# Patient Record
Sex: Male | Born: 2000 | Race: Black or African American | Hispanic: No | Marital: Single | Smoking: Never smoker
Health system: Southern US, Community
[De-identification: ages and names within clinical notes are randomized; demographics above are authoritative.]

---
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
## 2018-03-11 DIAGNOSIS — Z713 Dietary counseling and surveillance: Secondary | ICD-10-CM | POA: Diagnosis not present

## 2018-03-11 DIAGNOSIS — Z7182 Exercise counseling: Secondary | ICD-10-CM | POA: Diagnosis not present

## 2018-03-11 DIAGNOSIS — Z0000 Encounter for general adult medical examination without abnormal findings: Secondary | ICD-10-CM | POA: Diagnosis not present

## 2020-06-04 ENCOUNTER — Encounter: Payer: Self-pay | Admitting: Internal Medicine

## 2020-06-04 ENCOUNTER — Ambulatory Visit: Payer: Self-pay | Admitting: Internal Medicine

## 2020-06-04 ENCOUNTER — Encounter (INDEPENDENT_AMBULATORY_CARE_PROVIDER_SITE_OTHER): Payer: Self-pay

## 2020-06-04 ENCOUNTER — Other Ambulatory Visit: Payer: Self-pay

## 2020-06-04 ENCOUNTER — Ambulatory Visit: Payer: 59 | Admitting: Internal Medicine

## 2020-06-04 VITALS — BP 116/78 | HR 68 | Temp 97.8°F | Resp 16 | Ht 70.0 in | Wt 127.0 lb

## 2020-06-04 DIAGNOSIS — Z0000 Encounter for general adult medical examination without abnormal findings: Secondary | ICD-10-CM | POA: Insufficient documentation

## 2020-06-04 DIAGNOSIS — M2041 Other hammer toe(s) (acquired), right foot: Secondary | ICD-10-CM | POA: Diagnosis not present

## 2020-06-04 DIAGNOSIS — M2042 Other hammer toe(s) (acquired), left foot: Secondary | ICD-10-CM

## 2020-06-04 DIAGNOSIS — Z0001 Encounter for general adult medical examination with abnormal findings: Secondary | ICD-10-CM | POA: Diagnosis not present

## 2020-06-04 DIAGNOSIS — J301 Allergic rhinitis due to pollen: Secondary | ICD-10-CM

## 2020-06-04 DIAGNOSIS — J4520 Mild intermittent asthma, uncomplicated: Secondary | ICD-10-CM

## 2020-06-04 DIAGNOSIS — Z23 Encounter for immunization: Secondary | ICD-10-CM | POA: Diagnosis not present

## 2020-06-04 LAB — CBC WITH DIFFERENTIAL/PLATELET
Basophils Absolute: 0 10*3/uL (ref 0.0–0.1)
Basophils Relative: 0.8 % (ref 0.0–3.0)
Eosinophils Absolute: 0.2 10*3/uL (ref 0.0–0.7)
Eosinophils Relative: 4 % (ref 0.0–5.0)
HCT: 43.4 % (ref 39.0–52.0)
Hemoglobin: 14.4 g/dL (ref 13.0–17.0)
Lymphocytes Relative: 47.7 % — ABNORMAL HIGH (ref 12.0–46.0)
Lymphs Abs: 1.9 10*3/uL (ref 0.7–4.0)
MCHC: 33.3 g/dL (ref 30.0–36.0)
MCV: 89.5 fl (ref 78.0–100.0)
Monocytes Absolute: 0.3 10*3/uL (ref 0.1–1.0)
Monocytes Relative: 8.5 % (ref 3.0–12.0)
Neutro Abs: 1.5 10*3/uL (ref 1.4–7.7)
Neutrophils Relative %: 39 % — ABNORMAL LOW (ref 43.0–77.0)
Platelets: 281 10*3/uL (ref 150.0–400.0)
RBC: 4.85 Mil/uL (ref 4.22–5.81)
RDW: 14.3 % (ref 11.5–15.5)
WBC: 4 10*3/uL (ref 4.0–10.5)

## 2020-06-04 LAB — LIPID PANEL
Cholesterol: 162 mg/dL (ref 0–200)
HDL: 67.4 mg/dL (ref >39.00)
LDL Cholesterol: 79 mg/dL (ref 0–99)
NonHDL: 94.79
Total CHOL/HDL Ratio: 2
Triglycerides: 77 mg/dL (ref 0.0–149.0)
VLDL: 15.4 mg/dL (ref 0.0–40.0)

## 2020-06-04 MED ORDER — FLUTICASONE-SALMETEROL 115-21 MCG/ACT IN AERO: 2.0000 | INHALATION_SPRAY | Freq: Two times a day (BID) | RESPIRATORY_TRACT | 1 refills | Status: AC

## 2020-06-04 MED ORDER — FLUTICASONE PROPIONATE 50 MCG/ACT NA SUSP
2.0000 | Freq: Every day | NASAL | 1 refills | Status: DC
Start: 2020-06-04 — End: 2021-07-12

## 2020-06-04 MED ORDER — CETIRIZINE HCL 10 MG PO TABS: 10.0000 mg | ORAL_TABLET | Freq: Every day | ORAL | 1 refills | Status: AC

## 2020-06-04 MED ORDER — ALBUTEROL SULFATE HFA 108 (90 BASE) MCG/ACT IN AERS
1.0000 | INHALATION_SPRAY | Freq: Four times a day (QID) | RESPIRATORY_TRACT | 3 refills | Status: AC | PRN
Start: 2020-06-04 — End: ?

## 2020-06-04 NOTE — Progress Notes (Signed)
Subjective:  Patient ID: Jeffrey Palmer, male    DOB: [birth year]  Age: 20 y.o. MRN: [id number]  CC: Annual Exam, Asthma, and Allergic Rhinitis   This visit occurred during the SARS-CoV-2 public health emergency.  Safety protocols were in place, including screening questions prior to the visit, additional usage of staff PPE, and extensive cleaning of exam room while observing appropriate contact time as indicated for disinfecting solutions.    HPI Jeffrey Palmer presents for a CPX and to establish.  He has a history of asthma and needs to get some inhalers to treat the upcoming season.  He also has a history of allergic rhinitis.  He complains of hammertoes and would like to see a podiatrist.  History Jeffrey Palmer has no past medical history on file.   He has a past surgical history that includes Wisdom tooth extraction.   His family history includes Diabetes in his maternal grandmother; Healthy in his mother; Kidney disease in his paternal grandfather and paternal grandmother.He reports that he has never smoked. He has never used smokeless tobacco. He reports that he does not drink alcohol and does not use drugs.  No outpatient medications prior to visit.   No facility-administered medications prior to visit.    ROS Review of Systems  Constitutional: Negative.  Negative for chills, diaphoresis, fatigue and fever.  HENT: Positive for congestion, postnasal drip and rhinorrhea.   Eyes: Negative.   Respiratory: Negative for cough, chest tightness, shortness of breath and wheezing.   Cardiovascular: Negative for chest pain, palpitations and leg swelling.  Gastrointestinal: Negative for abdominal pain, constipation, diarrhea, nausea and vomiting.  Endocrine: Negative.   Genitourinary: Negative.  Negative for difficulty urinating, dysuria, penile pain, penile swelling, scrotal swelling and testicular pain.  Musculoskeletal: Negative for arthralgias, back pain and myalgias.  Skin: Negative.   Negative for color change and pallor.  Neurological: Negative.  Negative for dizziness and weakness.  Hematological: Negative for adenopathy. Does not bruise/bleed easily.  Psychiatric/Behavioral: Negative.     Objective:  BP 116/78   Pulse 68   Temp 97.8 F (36.6 C) (Oral)   Resp 16   Ht 5\' 10"  (1.778 m)   Wt 127 lb (57.6 kg)   SpO2 98%   BMI 18.22 kg/m   Physical Exam Vitals reviewed.  Constitutional:      Appearance: Normal appearance.  HENT:     Nose: Nose normal. No rhinorrhea.     Mouth/Throat:     Lips: Pink.     Mouth: Mucous membranes are moist.     Pharynx: Oropharynx is clear.  Eyes:     General: No scleral icterus.    Conjunctiva/sclera: Conjunctivae normal.  Cardiovascular:     Rate and Rhythm: Normal rate and regular rhythm.     Heart sounds: No murmur heard.   Pulmonary:     Effort: Pulmonary effort is normal.     Breath sounds: No stridor. No wheezing, rhonchi or rales.  Abdominal:     General: Abdomen is flat. Bowel sounds are normal. There is no distension.     Palpations: Abdomen is soft. There is no hepatomegaly, splenomegaly or mass.  Musculoskeletal:        General: Normal range of motion.     Cervical back: Neck supple.     Right lower leg: No edema.     Left lower leg: No edema.  Lymphadenopathy:     Cervical: No cervical adenopathy.  Skin:    General: Skin is warm and dry.  Neurological:     General: No focal deficit present.     Mental Status: He is alert.       Assessment & Plan:   Jeffrey Palmer was seen today for annual exam, asthma and allergic rhinitis .  Diagnoses and all orders for this visit:  Mild intermittent asthma without complication -     fluticasone-salmeterol (ADVAIR HFA) 115-21 MCG/ACT inhaler; Inhale 2 puffs into the lungs 2 (two) times daily. -     albuterol (VENTOLIN HFA) 108 (90 Base) MCG/ACT inhaler; Inhale 1 puff into the lungs every 6 (six) hours as needed for wheezing or shortness of breath. -     CBC with  Differential/Platelet; Future -     CBC with Differential/Platelet  Seasonal allergic rhinitis due to pollen -     cetirizine (ZYRTEC) 10 MG tablet; Take 1 tablet (10 mg total) by mouth daily. -     fluticasone (FLONASE) 50 MCG/ACT nasal spray; Place 2 sprays into both nostrils daily.  Routine general medical examination at a health care facility-Exam completed, labs reviewed, vaccines reviewed - He agreed to receive a Tdap but was not willing to get a pneumonia vaccine, patient education was given. -     Lipid panel; Future -     HIV Antibody (routine testing w rflx); Future -     Hepatitis C antibody; Future -     Hepatitis C antibody -     HIV Antibody (routine testing w rflx) -     Lipid panel  Hammer toes, bilateral -     Ambulatory referral to Podiatry  Other orders -     Tdap vaccine greater than or equal to 7yo IM   I am having Jeffrey Palmer start on cetirizine, fluticasone, fluticasone-salmeterol, and albuterol.  Meds ordered this encounter  Medications  . cetirizine (ZYRTEC) 10 MG tablet    Sig: Take 1 tablet (10 mg total) by mouth daily.    Dispense:  90 tablet    Refill:  1  . fluticasone (FLONASE) 50 MCG/ACT nasal spray    Sig: Place 2 sprays into both nostrils daily.    Dispense:  48 g    Refill:  1  . fluticasone-salmeterol (ADVAIR HFA) 115-21 MCG/ACT inhaler    Sig: Inhale 2 puffs into the lungs 2 (two) times daily.    Dispense:  3 each    Refill:  1  . albuterol (VENTOLIN HFA) 108 (90 Base) MCG/ACT inhaler    Sig: Inhale 1 puff into the lungs every 6 (six) hours as needed for wheezing or shortness of breath.    Dispense:  8 g    Refill:  3     Follow-up: Return in about 6 months (around 12/05/2020).  Sanda Linger, MD

## 2020-06-04 NOTE — Patient Instructions (Signed)
http://www.aaaai.org/conditions-and-treatments/asthma">  Asthma, Adult  Asthma is a long-term (chronic) condition that causes recurrent episodes in which the airways become tight and narrow. The airways are the passages that lead from the nose and mouth down into the lungs. Asthma episodes, also called asthma attacks, can cause coughing, wheezing, shortness of breath, and chest pain. The airways can also fill with mucus. During an attack, it can be difficult to breathe. Asthma attacks can range from minor to life threatening. Asthma cannot be cured, but medicines and lifestyle changes can help control it and treat acute attacks. What are the causes? This condition is believed to be caused by inherited (genetic) and environmental factors, but its exact cause is not known. There are many things that can bring on an asthma attack or make asthma symptoms worse (triggers). Asthma triggers are different for each person. Common triggers include:  Mold.  Dust.  Cigarette smoke.  Cockroaches.  Things that can cause allergy symptoms (allergens), such as animal dander or pollen from trees or grass.  Air pollutants such as household cleaners, wood smoke, smog, or chemical odors.  Cold air, weather changes, and winds (which increase molds and pollen in the air).  Strong emotional expressions such as crying or laughing hard.  Stress.  Certain medicines (such as aspirin) or types of medicines (such as beta-blockers).  Sulfites in foods and drinks. Foods and drinks that may contain sulfites include dried fruit, potato chips, and sparkling grape juice.  Infections or inflammatory conditions such as the flu, a cold, or inflammation of the nasal membranes (rhinitis).  Gastroesophageal reflux disease (GERD).  Exercise or strenuous activity. What are the signs or symptoms? Symptoms of this condition may occur right after asthma is triggered or many hours later. Symptoms include:  Wheezing. This can  sound like whistling when you breathe.  Excessive nighttime or early morning coughing.  Frequent or severe coughing with a common cold.  Chest tightness.  Shortness of breath.  Tiredness (fatigue) with minimal activity. How is this diagnosed? This condition is diagnosed based on:  Your medical history.  A physical exam.  Tests, which may include: ? Lung function studies and pulmonary studies (spirometry). These tests can evaluate the flow of air in your lungs. ? Allergy tests. ? Imaging tests, such as X-rays. How is this treated? There is no cure for this condition, but treatment can help control your symptoms. Treatment for asthma usually involves:  Identifying and avoiding your asthma triggers.  Using medicines to control your symptoms. Generally, two types of medicines are used to treat asthma: ? Controller medicines. These help prevent asthma symptoms from occurring. They are usually taken every day. ? Fast-acting reliever or rescue medicines. These quickly relieve asthma symptoms by widening the narrow and tight airways. They are used as needed and provide short-term relief.  Using supplemental oxygen. This may be needed during a severe episode.  Using other medicines, such as: ? Allergy medicines, such as antihistamines, if your asthma attacks are triggered by allergens. ? Immune medicines (immunomodulators). These are medicines that help control the immune system.  Creating an asthma action plan. An asthma action plan is a written plan for managing and treating your asthma attacks. This plan includes: ? A list of your asthma triggers and how to avoid them. ? Information about when medicines should be taken and when their dosage should be changed. ? Instructions about using a device called a peak flow meter. A peak flow meter measures how well the lungs are working   and the severity of your asthma. It helps you monitor your condition. Follow these instructions at  home: Controlling your home environment Control your home environment in the following ways to help avoid triggers and prevent asthma attacks:  Change your heating and air conditioning filter regularly.  Limit your use of fireplaces and wood stoves.  Get rid of pests (such as roaches and mice) and their droppings.  Throw away plants if you see mold on them.  Clean floors and dust surfaces regularly. Use unscented cleaning products.  Try to have someone else vacuum for you regularly. Stay out of rooms while they are being vacuumed and for a short while afterward. If you vacuum, use a dust mask from a hardware store, a double-layered or microfilter vacuum cleaner bag, or a vacuum cleaner with a HEPA filter.  Replace carpet with wood, tile, or vinyl flooring. Carpet can trap dander and dust.  Use allergy-proof pillows, mattress covers, and box spring covers.  Keep your bedroom a trigger-free room.  Avoid pets and keep windows closed when allergens are in the air.  Wash beddings every week in hot water and dry them in a dryer.  Use blankets that are made of polyester or cotton.  Clean bathrooms and kitchens with bleach. If possible, have someone repaint the walls in these rooms with mold-resistant paint. Stay out of the rooms that are being cleaned and painted.  Wash your hands often with soap and water. If soap and water are not available, use hand sanitizer.  Do not allow anyone to smoke in your home. General instructions  Take over-the-counter and prescription medicines only as told by your health care provider. ? Speak with your health care provider if you have questions about how or when to take the medicines. ? Make note if you are requiring more frequent dosages.  Do not use any products that contain nicotine or tobacco, such as cigarettes and e-cigarettes. If you need help quitting, ask your health care provider. Also, avoid being exposed to secondhand smoke.  Use a peak  flow meter as told by your health care provider. Record and keep track of the readings.  Understand and use the asthma action plan to help minimize, or stop an asthma attack, without needing to seek medical care.  Make sure you stay up to date on your yearly vaccinations as told by your health care provider. This may include vaccines for the flu and pneumonia.  Avoid outdoor activities when allergen counts are high and when air quality is low.  Wear a ski mask that covers your nose and mouth during outdoor winter activities. Exercise indoors on cold days if you can.  Warm up before exercising, and take time for a cool-down period after exercise.  Keep all follow-up visits as told by your health care provider. This is important. Where to find more information  For information about asthma, turn to the Centers for Disease Control and Prevention at www.cdc.gov/asthma/faqs  For air quality information, turn to AirNow at airnow.gov Contact a health care provider if:  You have wheezing, shortness of breath, or a cough even while you are taking medicine to prevent attacks.  The mucus you cough up (sputum) is thicker than usual.  Your sputum changes from clear or white to yellow, green, gray, or bloody.  Your medicines are causing side effects, such as a rash, itching, swelling, or trouble breathing.  You need to use a reliever medicine more than 2-3 times a week.  Your peak   flow reading is still at 50-79% of your personal best after following your action plan for 1 hour.  You have a fever. Get help right away if:  You are getting worse and do not respond to treatment during an asthma attack.  You are short of breath when at rest or when doing very little physical activity.  You have difficulty eating, drinking, or talking.  You have chest pain or tightness.  You develop a fast heartbeat or palpitations.  You have a bluish color to your lips or fingernails.  You are  light-headed or dizzy, or you faint.  Your peak flow reading is less than 50% of your personal best.  You feel too tired to breathe normally. Summary  Asthma is a long-term (chronic) condition that causes recurrent episodes in which the airways become tight and narrow. These episodes can cause coughing, wheezing, shortness of breath, and chest pain.  Asthma cannot be cured, but medicines and lifestyle changes can help control it and treat acute attacks.  Make sure you understand how to avoid triggers and how and when to use your medicines.  Asthma attacks can range from minor to life threatening. Get help right away if you have an asthma attack and do not respond to treatment with your usual rescue medicines. This information is not intended to replace advice given to you by your health care provider. Make sure you discuss any questions you have with your health care provider. Document Revised: 12/08/2019 Document Reviewed: 07/12/2019 [employer]vier Patient Education  2021 [employer]  

## 2020-06-05 ENCOUNTER — Encounter: Payer: Self-pay | Admitting: Internal Medicine

## 2020-06-05 LAB — HEPATITIS C ANTIBODY
Hepatitis C Ab: NONREACTIVE
SIGNAL TO CUT-OFF: 0.02 (ref <1.00)

## 2020-06-05 LAB — HIV ANTIBODY (ROUTINE TESTING W REFLEX): HIV 1&2 Ab, 4th Generation: NONREACTIVE

## 2020-06-09 ENCOUNTER — Encounter: Payer: Self-pay | Admitting: Internal Medicine

## 2020-06-18 ENCOUNTER — Ambulatory Visit (INDEPENDENT_AMBULATORY_CARE_PROVIDER_SITE_OTHER): Payer: 59

## 2020-06-18 ENCOUNTER — Ambulatory Visit: Payer: 59 | Admitting: Podiatry

## 2020-06-18 ENCOUNTER — Other Ambulatory Visit: Payer: Self-pay

## 2020-06-18 ENCOUNTER — Encounter: Payer: Self-pay | Admitting: Podiatry

## 2020-06-18 DIAGNOSIS — M2041 Other hammer toe(s) (acquired), right foot: Secondary | ICD-10-CM | POA: Diagnosis not present

## 2020-06-18 DIAGNOSIS — M2042 Other hammer toe(s) (acquired), left foot: Secondary | ICD-10-CM

## 2020-06-18 DIAGNOSIS — M205X9 Other deformities of toe(s) (acquired), unspecified foot: Secondary | ICD-10-CM

## 2020-06-18 NOTE — Patient Instructions (Signed)
https://orthoinfo.aaos.org/en/diseases--conditions/hammer-toe">  Hammer Toe Hammer toe is a change in the shape, or a deformity, of the toe. The deformity causes the middle joint of the toe to stay bent. Hammer toe starts gradually. At first, the toe can be straightened. Then over time, the toe deformity becomes stiff, inflexible, and permanently bent. Hammer toe usually affects the second, third, or fourth toe. A hammer toe causes pain, especially when wearing shoes. Corns and calluses can result from the toe rubbing against the inside of the shoe. Early treatments to keep the toe straight may relieve pain. As the deformity of the toe becomes stiff and permanent, surgery may be needed to straighten the toe. What are the causes? This condition is caused by abnormal bending of the toe joint that is closest to your foot. Over time, the toe bending downward pulls on the muscles and connections (tendons) of the toe joint, making them weak and stiff. Wearing shoes that are too narrow in the toe box and do not allow toes to fully straighten can cause this condition. What increases the risk? You are more likely to develop this condition if you:  Are an older male.  Wear shoes that are too small, or wear high-heeled shoes that pinch your toes.  Have a second toe that is longer than your big toe (first toe).  Injure your foot or toe.  Have arthritis, or have a nerve or muscle disorder.  Have diabetes or a condition known as Charcot joint, which may cause you to walk abnormally.  Have a family history of hammer toe.  Are a ballet dancer. What are the signs or symptoms? Pain and deformity of the toe are the main symptoms of this condition. The pain is worse when wearing shoes, walking, or running. Other symptoms may include:  A thickened patch of skin, called a corn or callus, that forms over the top of the bent part of the toe or between the toes.  Redness and a burning feeling on the bent  toe.  An open sore that forms on the top of the bent toe.  Not being able to straighten the affected toe.   How is this diagnosed? This condition is diagnosed based on your symptoms and a physical exam. During the exam, your health care provider will try to straighten your toe to see how stiff the deformity is. You may also have tests, such as:  A blood test to check for rheumatoid arthritis or diabetes.  An X-ray to show how severe the toe deformity is. How is this treated? Treatment for this condition depends on whether the toe is flexible or deformed and no longer moveable. In less severe cases, a hammer toe can be straightened without surgery. These treatments include:  Taping the toe into a straightened position.  Using pads and cushions to protect the bent toe.  Wearing shoes that provide enough room for the toes.  Doing toe-stretching exercises at home.  Taking an NSAID, such as ibuprofen, to reduce pain and swelling.  Using special orthotics or insoles for pain relief and to improve walking. If these treatments do not help or the toe has a severe deformity and cannot be straightened, surgery is the next option. The most common surgeries used to straighten a hammer toe include:  Arthroplasty or osteotomy. Part of the toe joint is reconstructed or removed, which allows the toe to straighten.  Fusion. Cartilage between the two bones of the joint is taken out, and the bones are fused together   into one longer bone.  Implantation. Part of the bone is removed and replaced with an implant to allow the toe to move again.  Flexor tendon transfer. The tendons that curl the toes down (flexor tendons) are repositioned. Follow these instructions at home:  Take over-the-counter and prescription medicines only as told by your health care provider.  Do toe-straightening and stretching exercises as told by your health care provider.  Keep all follow-up visits. This is important. How is  this prevented?  Wear shoes that fit properly and give your toes enough room. Shoes should not cause pain.  Buy shoes at the end of the day to make sure they fit well, since your foot may swell during the day. Make sure they are comfortable before you buy them.  As you age, your shoe size might change, including the width. Measure both feet and buy shoes for the larger foot.  A shoe repair store might be able to stretch shoes that feel tight in spots.  Do not wear high-heeled shoes or shoes with pointed toes. Contact a health care provider if:  Your pain gets worse.  Your toe becomes red or swollen.  You develop an open sore on your toe. Summary  Hammer toe is a condition that gradually causes your toe to become bent and stiff.  Hammer toe can be treated by taping the toe into a straightened position and doing toe-stretching exercises. If these treatments do not help, surgery may be needed.  To prevent this condition, wear shoes that fit properly, give your toes enough room, and do not cause pain. This information is not intended to replace advice given to you by your health care provider. Make sure you discuss any questions you have with your health care provider. Document Revised: 06/15/2019 Document Reviewed: 06/15/2019 Elsevier Patient Education  2021 [employer]  

## 2020-06-20 NOTE — Progress Notes (Signed)
Subjective:   Patient ID: Jeffrey Palmer, male   DOB: 20 y.o.   MRN: [id number]   HPI 20 year old male presents the office with his mom for concern for hammertoes.  This been a ongoing issue for several years and previously seen another provider for this.  Never had any treatment for the hammertoes.  Does not cause any issues any balance issues.  He has no other concerns today.  No swelling or redness or any open lesions or callus formation.   Review of Systems  All other systems reviewed and are negative.  No past medical history on file.  Past Surgical History:  Procedure Laterality Date  . WISDOM TOOTH EXTRACTION       Current Outpatient Medications:  .  albuterol (VENTOLIN HFA) 108 (90 Base) MCG/ACT inhaler, Inhale 1 puff into the lungs every 6 (six) hours as needed for wheezing or shortness of breath., Disp: 8 g, Rfl: 3 .  cetirizine (ZYRTEC) 10 MG tablet, Take 1 tablet (10 mg total) by mouth daily., Disp: 90 tablet, Rfl: 1 .  fluticasone (FLONASE) 50 MCG/ACT nasal spray, Place 2 sprays into both nostrils daily., Disp: 48 g, Rfl: 1 .  fluticasone-salmeterol (ADVAIR HFA) 115-21 MCG/ACT inhaler, Inhale 2 puffs into the lungs 2 (two) times daily., Disp: 3 each, Rfl: 1  Allergies  Allergen Reactions  . Other     pollen         Objective:  Physical Exam  General: AAO x3, NAD  Dermatological: Skin is warm, dry and supple bilateral. Nails x 10 are well manicured; remaining integument appears unremarkable at this time. There are no open sores, no preulcerative lesions, no rash or signs of infection present.  Vascular: Dorsalis Pedis artery and Posterior Tibial artery pedal pulses are 2/4 bilateral with immedate capillary fill time. There is no pain with calf compression, swelling, warmth, erythema.   Neruologic: Grossly intact via light touch bilateral.   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  There is no pain of the toes there is no edema, erythema.  No open lesions or preulcerative calluses.  No areas of tenderness identified bilaterally.  Flatfoot is present.  Gait: Unassisted, Nonantalgic.       Assessment:   Bilateral digital deformity, hammertoe     Plan:  -Treatment options discussed including all alternatives, risks, and complications -Etiology of symptoms were discussed -X-rays were obtained and reviewed with the patient.  Digital deformities are present.  No evidence of acute fracture. -We discussed both conservative as well as surgical treatment options.  At this point he is not having any pain.  We discussed surgical intervention but given the risks I do not think the benefits outweigh the surgery at this point given his lack of symptoms.  However I do not continue with good shoes, arch supports I dispensed a toe crest.  If or when the toes become symptomatic would recommend doing surgery at that point.  They are in agreement to this plan no further questions.  Vivi Barrack DPM

## 2021-07-12 ENCOUNTER — Other Ambulatory Visit: Payer: Self-pay | Admitting: Internal Medicine

## 2021-07-12 DIAGNOSIS — J301 Allergic rhinitis due to pollen: Secondary | ICD-10-CM

## 2024-07-28 ENCOUNTER — Ambulatory Visit: Admitting: Nurse Practitioner
